# Patient Record
Sex: Male | Born: 1995 | Race: White | Hispanic: No | Marital: Single | Smoking: Never smoker
Health system: Southern US, Community
[De-identification: ages and names within clinical notes are randomized; demographics above are authoritative.]

---
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
## 2014-10-15 ENCOUNTER — Emergency Department (HOSPITAL_BASED_OUTPATIENT_CLINIC_OR_DEPARTMENT_OTHER)
Admission: EM | Admit: 2014-10-15 | Discharge: 2014-10-15 | Disposition: A | Discharge status: Home / Self Care | Payer: Worker's Compensation | Attending: Emergency Medicine | Admitting: Emergency Medicine

## 2014-10-15 ENCOUNTER — Encounter (HOSPITAL_BASED_OUTPATIENT_CLINIC_OR_DEPARTMENT_OTHER): Payer: Self-pay | Admitting: *Deleted

## 2014-10-15 ENCOUNTER — Emergency Department (HOSPITAL_BASED_OUTPATIENT_CLINIC_OR_DEPARTMENT_OTHER): Payer: Worker's Compensation

## 2014-10-15 DIAGNOSIS — Y990 Civilian activity done for income or pay: Secondary | ICD-10-CM | POA: Insufficient documentation

## 2014-10-15 DIAGNOSIS — Y9289 Other specified places as the place of occurrence of the external cause: Secondary | ICD-10-CM | POA: Diagnosis not present

## 2014-10-15 DIAGNOSIS — M62830 Muscle spasm of back: Secondary | ICD-10-CM | POA: Diagnosis not present

## 2014-10-15 DIAGNOSIS — Y9389 Activity, other specified: Secondary | ICD-10-CM | POA: Insufficient documentation

## 2014-10-15 DIAGNOSIS — S3992XA Unspecified injury of lower back, initial encounter: Secondary | ICD-10-CM | POA: Insufficient documentation

## 2014-10-15 DIAGNOSIS — X58XXXA Exposure to other specified factors, initial encounter: Secondary | ICD-10-CM | POA: Diagnosis not present

## 2014-10-15 MED ORDER — HYDROCODONE-ACETAMINOPHEN 5-325 MG PO TABS
1.0000 | ORAL_TABLET | Freq: Once | ORAL | Status: AC
  Administered 2014-10-15: 1 via ORAL
  Filled 2014-10-15: qty 1

## 2014-10-15 MED ORDER — HYDROCODONE-ACETAMINOPHEN 5-325 MG PO TABS: 1.0000 | ORAL_TABLET | Freq: Four times a day (QID) | ORAL | Status: AC | PRN

## 2014-10-15 MED ORDER — CYCLOBENZAPRINE HCL 10 MG PO TABS: 10.0000 mg | ORAL_TABLET | Freq: Three times a day (TID) | ORAL | Status: AC | PRN

## 2014-10-15 MED ORDER — CYCLOBENZAPRINE HCL 10 MG PO TABS
10.0000 mg | ORAL_TABLET | Freq: Once | ORAL | Status: AC
  Administered 2014-10-15: 10 mg via ORAL
  Filled 2014-10-15: qty 1

## 2014-10-15 NOTE — ED Provider Notes (Signed)
CSN: [id number]     Arrival date & time 10/15/14  0058 History   First MD Initiated Contact with Patient 10/15/14 0215     Chief Complaint  Patient presents with  . 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 It started to fall and when he went to stop it he twisted his back and felt the sudden onset of pain. He describes the pain as being in the right lower ribs on the back. The pain is moderate to severe. It is worse with movement or deep breathing. He is having difficulty taking deep breaths due to pain. He denies being struck. He denies other injury.  History reviewed. No pertinent past medical history. History reviewed. No pertinent past surgical history. History reviewed. No pertinent family history. History  Substance Use Topics  . Smoking status: Never Smoker   . Smokeless tobacco: Not on file  . Alcohol Use: No    Review of Systems  All other systems reviewed and are negative.   Allergies  Review of patient's allergies indicates no known allergies.  Home Medications   Prior to Admission medications   Not on File   BP 140/79 mmHg  Pulse 91  Temp(Src) 98.2 F (36.8 C) (Oral)  Resp 18  Wt 130 lb (58.968 kg)  SpO2 100%   Physical Exam  General: Well-developed, well-nourished male in no acute distress; appearance consistent with age of record HENT: normocephalic; atraumatic Eyes: pupils equal, round and reactive to light; extraocular muscles intact Neck: supple Heart: regular rate and rhythm Lungs: clear to auscultation bilaterally; shallow breaths due to pain Abdomen: soft; nondistended; nontender; bowel sounds present Back: Tenderness below the right inferior posterior ribs with spasm and tenderness of the adjacent paraspinal muscles Extremities: No deformity; full range of motion; pulses  normal Neurologic: Awake, alert and oriented; motor function intact in all extremities and symmetric; no facial droop Skin: Warm and dry Psychiatric: Flat affect    ED Course  Procedures (including critical care time)   MDM  Nursing notes and vitals signs, including pulse oximetry, reviewed.  Summary of this visit's results, reviewed by myself:  Imaging Studies: Dg Ribs Unilateral W/chest Right  10/15/2014   CLINICAL DATA:  Right lower rib pain, after twisting injury at work. Initial encounter.  EXAM: RIGHT RIBS AND CHEST - 3+ VIEW  COMPARISON:  None.  FINDINGS: No displaced rib fractures are seen. The site of the patient's symptoms is below the level of the right twelfth rib.  The lungs are well-aerated and clear. There is no evidence of focal opacification, pleural effusion or pneumothorax.  The cardiomediastinal silhouette is within normal limits. No acute osseous abnormalities are seen.  IMPRESSION: No acute cardiopulmonary process seen; no displaced rib fractures identified. The site of the patient's symptoms is below the level of the right twelfth rib.   Electronically Signed   By: Roanna RaiderJeffery  Chang M.D.   On: 10/15/2014 03:08      Hanley SeamenJohn L Mana Haberl, MD 10/15/14 [id number]

## 2014-10-15 NOTE — ED Notes (Signed)
Pt hurt back while twisting at work.

## 2017-01-20 ENCOUNTER — Encounter (HOSPITAL_COMMUNITY): Payer: Self-pay | Admitting: Emergency Medicine

## 2017-01-20 ENCOUNTER — Emergency Department (HOSPITAL_COMMUNITY): Payer: Managed Care, Other (non HMO)

## 2017-01-20 ENCOUNTER — Emergency Department (HOSPITAL_COMMUNITY)
Admission: EM | Admit: 2017-01-20 | Discharge: 2017-01-20 | Disposition: A | Discharge status: Home / Self Care | Payer: Managed Care, Other (non HMO) | Attending: Emergency Medicine | Admitting: Emergency Medicine

## 2017-01-20 DIAGNOSIS — R112 Nausea with vomiting, unspecified: Secondary | ICD-10-CM

## 2017-01-20 DIAGNOSIS — K219 Gastro-esophageal reflux disease without esophagitis: Secondary | ICD-10-CM | POA: Diagnosis not present

## 2017-01-20 DIAGNOSIS — R1011 Right upper quadrant pain: Secondary | ICD-10-CM

## 2017-01-20 LAB — COMPREHENSIVE METABOLIC PANEL
ALT: 14 U/L — ABNORMAL LOW (ref 17–63)
ANION GAP: 10 (ref 5–15)
AST: 25 U/L (ref 15–41)
Albumin: 5 g/dL (ref 3.5–5.0)
Alkaline Phosphatase: 86 U/L (ref 38–126)
BILIRUBIN TOTAL: 1.2 mg/dL (ref 0.3–1.2)
BUN: 13 mg/dL (ref 6–20)
CHLORIDE: 102 mmol/L (ref 101–111)
CO2: 26 mmol/L (ref 22–32)
Calcium: 10 mg/dL (ref 8.9–10.3)
Creatinine, Ser: 0.96 mg/dL (ref 0.61–1.24)
Glucose, Bld: 92 mg/dL (ref 65–99)
POTASSIUM: 3.8 mmol/L (ref 3.5–5.1)
Sodium: 138 mmol/L (ref 135–145)
TOTAL PROTEIN: 7.8 g/dL (ref 6.5–8.1)

## 2017-01-20 LAB — CBC
HCT: 50 % (ref 39.0–52.0)
Hemoglobin: 17.6 g/dL — ABNORMAL HIGH (ref 13.0–17.0)
MCH: 30.5 pg (ref 26.0–34.0)
MCHC: 35.2 g/dL (ref 30.0–36.0)
MCV: 86.7 fL (ref 78.0–100.0)
PLATELETS: 253 10*3/uL (ref 150–400)
RBC: 5.77 MIL/uL (ref 4.22–5.81)
RDW: 12.7 % (ref 11.5–15.5)
WBC: 5.8 10*3/uL (ref 4.0–10.5)

## 2017-01-20 LAB — URINALYSIS, ROUTINE W REFLEX MICROSCOPIC
BILIRUBIN URINE: NEGATIVE
Glucose, UA: NEGATIVE mg/dL
HGB URINE DIPSTICK: NEGATIVE
Ketones, ur: NEGATIVE mg/dL
Leukocytes, UA: NEGATIVE
Nitrite: NEGATIVE
PROTEIN: NEGATIVE mg/dL
SPECIFIC GRAVITY, URINE: 1.012 (ref 1.005–1.030)
pH: 7 (ref 5.0–8.0)

## 2017-01-20 LAB — LIPASE, BLOOD: LIPASE: 26 U/L (ref 11–51)

## 2017-01-20 MED ORDER — ONDANSETRON 4 MG PO TBDP
4.0000 mg | ORAL_TABLET | Freq: Once | ORAL | Status: AC | PRN
Start: 2017-01-20 — End: 2017-01-20
  Administered 2017-01-20: 4 mg via ORAL
  Filled 2017-01-20: qty 1

## 2017-01-20 MED ORDER — MORPHINE SULFATE (PF) 4 MG/ML IV SOLN
4.0000 mg | Freq: Once | INTRAVENOUS | Status: AC
  Administered 2017-01-20: 4 mg via INTRAVENOUS
  Filled 2017-01-20: qty 1

## 2017-01-20 MED ORDER — GI COCKTAIL ~~LOC~~
30.0000 mL | Freq: Once | ORAL | Status: AC
  Administered 2017-01-20: 30 mL via ORAL
  Filled 2017-01-20: qty 30

## 2017-01-20 MED ORDER — SODIUM CHLORIDE 0.9 % IV BOLUS (SEPSIS)
1000.0000 mL | Freq: Once | INTRAVENOUS | Status: AC
  Administered 2017-01-20: 1000 mL via INTRAVENOUS

## 2017-01-20 MED ORDER — OMEPRAZOLE 20 MG PO CPDR: 20.0000 mg | DELAYED_RELEASE_CAPSULE | Freq: Every day | ORAL | 0 refills | Status: AC

## 2017-01-20 NOTE — ED Notes (Signed)
Patient transported to Ultrasound 

## 2017-01-20 NOTE — ED Provider Notes (Signed)
[facility]-EMERGENCY DEPT Provider Note   CSN: [id number] Arrival date & time: 01/20/17  1611     History   Chief Complaint Chief Complaint  Patient presents with  . Emesis    HPI Jorge Acosta is a 21 y.o. male.  Jorge Acosta is a 21 y.o. Male who presents to the emergency department with his mother complaining of nausea, vomiting and right upper quadrant and epigastric abdominal pain on and off for about 4 days. He was seen by his primary care provider 4 days ago was told he might have gastritis or gallbladder problems. He was started on Zofran. He reports his pain comes and goes and seems to be worse after eating. He denies previous abdominal surgeries. He denies excessive burping or belching. He denies fevers, hematemesis, diarrhea, urinary symptoms, chest pain, shortness of breath or rashes.   The history is provided by the patient, medical records and a parent. The history is limited by a language barrier. No language interpreter was used.  Emesis   Associated symptoms include abdominal pain. Pertinent negatives include no chills, no cough, no diarrhea, no fever and no headaches.  Diarrhea   Associated symptoms include abdominal pain and vomiting. Pertinent negatives include no chills, no headaches and no cough.    History reviewed. No pertinent past medical history.  There are no active problems to display for this patient.   History reviewed. No pertinent surgical history.     Home Medications    Prior to Admission medications   Medication Sig Start Date End Date Taking? Authorizing Provider  cyclobenzaprine (FLEXERIL) 10 MG tablet Take 1 tablet (10 mg total) by mouth 3 (three) times daily as needed for muscle spasms. 10/15/14   Molpus, John, MD  HYDROcodone-acetaminophen (NORCO/VICODIN) 5-325 MG per tablet Take 1-2 tablets by mouth every 6 (six) hours as needed (for pain). 10/15/14   Molpus, John, MD  omeprazole (PRILOSEC) 20 MG capsule Take 1 capsule (20 mg  total) by mouth daily. 01/20/17   Everlene Farrier, PA-C    Family History No family history on file.  Social History Social History  Substance Use Topics  . Smoking status: Never Smoker  . Smokeless tobacco: Never Used  . Alcohol use No     Allergies   Patient has no known allergies.   Review of Systems Review of Systems  Constitutional: Negative for chills and fever.  HENT: Negative for congestion and sore throat.   Eyes: Negative for visual disturbance.  Respiratory: Negative for cough and shortness of breath.   Cardiovascular: Negative for chest pain.  Gastrointestinal: Positive for abdominal pain, nausea and vomiting. Negative for abdominal distention, blood in stool and diarrhea.  Genitourinary: Negative for difficulty urinating, dysuria, flank pain, frequency, hematuria, penile pain, testicular pain and urgency.  Musculoskeletal: Negative for back pain and neck pain.  Skin: Negative for rash.  Neurological: Negative for light-headedness and headaches.     Physical Exam Updated Vital Signs BP 112/70 (BP Location: Right Arm)   Pulse (!) 54   Temp 98.6 F (37 C) (Oral)   Resp 16   Ht 5\' 9"  (1.753 m)   Wt 69.9 kg (154 lb)   SpO2 96%   BMI 22.74 kg/m   Physical Exam  Constitutional: He appears well-developed and well-nourished. No distress.  Nontoxic appearing. Thin male.  HENT:  Head: Normocephalic and atraumatic.  Mouth/Throat: Oropharynx is clear and moist.  Eyes: Conjunctivae are normal. Pupils are equal, round, and reactive to light. Right eye exhibits  no discharge. Left eye exhibits no discharge.  Neck: Neck supple.  Cardiovascular: Normal rate, regular rhythm, normal heart sounds and intact distal pulses.  Exam reveals no gallop and no friction rub.   No murmur heard. Pulmonary/Chest: Effort normal and breath sounds normal. No respiratory distress. He has no wheezes. He has no rales.  Abdominal: Soft. Bowel sounds are normal. He exhibits no distension  and no mass. There is tenderness. There is no rebound and no guarding. No hernia.  Abdomen is soft bowel sounds are present. Patient has mild epigastric and right upper quadrant abdominal tenderness to palpation. No CVA or flank tenderness.  Musculoskeletal: He exhibits no edema.  Lymphadenopathy:    He has no cervical adenopathy.  Neurological: He is alert. Coordination normal.  Skin: Skin is warm and dry. Capillary refill takes less than 2 seconds. No rash noted. He is not diaphoretic. No erythema. No pallor.  Psychiatric: He has a normal mood and affect. His behavior is normal.  Nursing note and vitals reviewed.    ED Treatments / Results  Labs (all labs ordered are listed, but only abnormal results are displayed) Labs Reviewed  COMPREHENSIVE METABOLIC PANEL - Abnormal; Notable for the following:       Result Value   ALT 14 (*)    All other components within normal limits  CBC - Abnormal; Notable for the following:    Hemoglobin 17.6 (*)    All other components within normal limits  LIPASE, BLOOD  URINALYSIS, ROUTINE W REFLEX MICROSCOPIC    EKG  EKG Interpretation None       Radiology [place] Abdomen Limited  Result Date: 01/20/2017 CLINICAL DATA:  Right upper quadrant abdominal pain x4 days, nausea/vomiting EXAM: ULTRASOUND ABDOMEN LIMITED RIGHT UPPER QUADRANT COMPARISON:  None. FINDINGS: Gallbladder: No gallstones, gallbladder wall thickening, or pericholecystic fluid. Common bile duct: Diameter: 3 mm Liver: No focal lesion identified. Within normal limits in parenchymal echogenicity. IMPRESSION: Negative right upper quadrant ultrasound. Electronically Signed   By: Charline BillsSriyesh  Krishnan M.D.   On: 01/20/2017 19:47    Procedures Procedures (including critical care time)  Medications Ordered in ED Medications  ondansetron (ZOFRAN-ODT) disintegrating tablet 4 mg (4 mg Oral Given 01/20/17 1955)  sodium chloride 0.9 % bolus 1,000 mL (0 mLs Intravenous Stopped 01/20/17 2056)  morphine  4 MG/ML injection 4 mg (4 mg Intravenous Given 01/20/17 2001)  gi cocktail (Maalox,Lidocaine,Donnatal) (30 mLs Oral Given 01/20/17 2004)     Initial Impression / Assessment and Plan / ED Course  I have reviewed the triage vital signs and the nursing notes.  Pertinent labs & imaging results that were available during my care of the patient were reviewed by me and considered in my medical decision making (see chart for details).    This is a 21 y.o. Male who presents to the emergency department with his mother complaining of nausea, vomiting and right upper quadrant and epigastric abdominal pain on and off for about 4 days. He was seen by his primary care provider 4 days ago was told he might have gastritis or gallbladder problems. He was started on Zofran. He reports his pain comes and goes and seems to be worse after eating. He denies previous abdominal surgeries. He denies excessive burping or belching. On exam the patient is afebrile and nontoxic appearing. His abdomen is soft and is not epigastric and right upper quadrant abdominal tenderness to palpation. No peritoneal signs. No hernias. Urinalysis shows no sign of infection. Lipase is within normal limits.  His CMP is unremarkable. Normal liver enzymes. CBC is unremarkable. No leukocytosis. Right upper quadrant ultrasound was obtained which is unremarkable. After pain medicine and GI cocktail patient reports his pain and nausea have completely resolved. He is tolerating graham crackers and ginger ale without nausea or vomiting. I suspect GERD versus peptic ulcer versus gallbladder dysfunction. If his symptoms persist will have follow-up at [facility] surgery. Otherwise discharge with omeprazole and Zofran. Patient has Zofran at home. Tylenol as needed for pain. I discussed return precautions. I advised the patient to follow-up with their primary care provider this week. I advised the patient to return to the emergency department with new or  worsening symptoms or new concerns. The patient verbalized understanding and agreement with plan.    Final Clinical Impressions(s) / ED Diagnoses   Final diagnoses:  Non-intractable vomiting with nausea, unspecified vomiting type  RUQ abdominal pain  Gastroesophageal reflux disease without esophagitis    New Prescriptions New Prescriptions   OMEPRAZOLE (PRILOSEC) 20 MG CAPSULE    Take 1 capsule (20 mg total) by mouth daily.     Everlene Farrier, PA-C 01/20/17 2120    Tegeler, Canary Brim, MD 01/21/17 [phone]

## 2017-01-20 NOTE — ED Triage Notes (Signed)
To ED with c/o abd pain with constant nausea-- saw reg dr -- Thursday -- was told that it possibly could be his gall bladder-- has continued nausea-- has been taking zofran --

## 2019-05-30 IMAGING — US US ABDOMEN LIMITED
1 series · 14 of 25 positions shown · non-contrast
Comparison: None.

CLINICAL DATA: Right upper quadrant abdominal pain x4 days,
nausea/vomiting

EXAM:
ULTRASOUND ABDOMEN LIMITED RIGHT UPPER QUADRANT

[Series 1: us abdomen limited · 0.14mm/px · 14 of 43 slices shown]
[im 1/43]
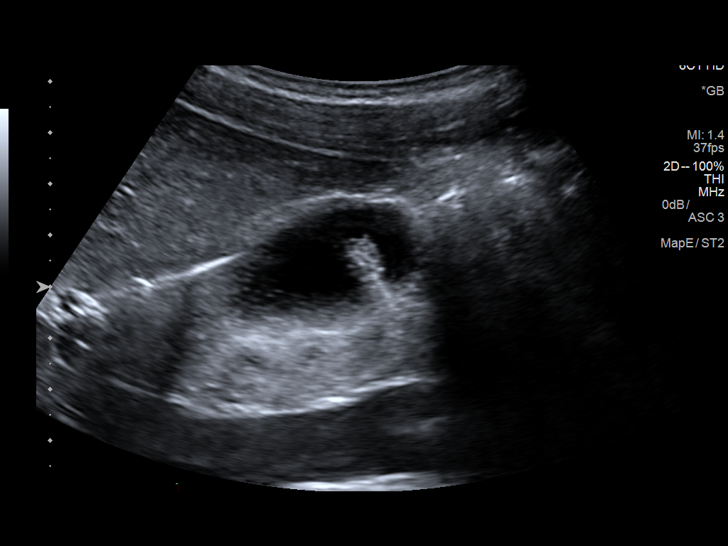
[im 4/43]
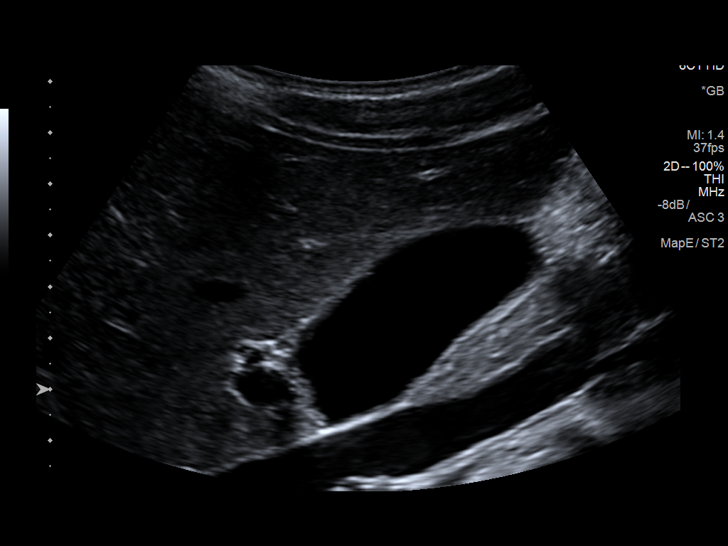
[im 8/43]
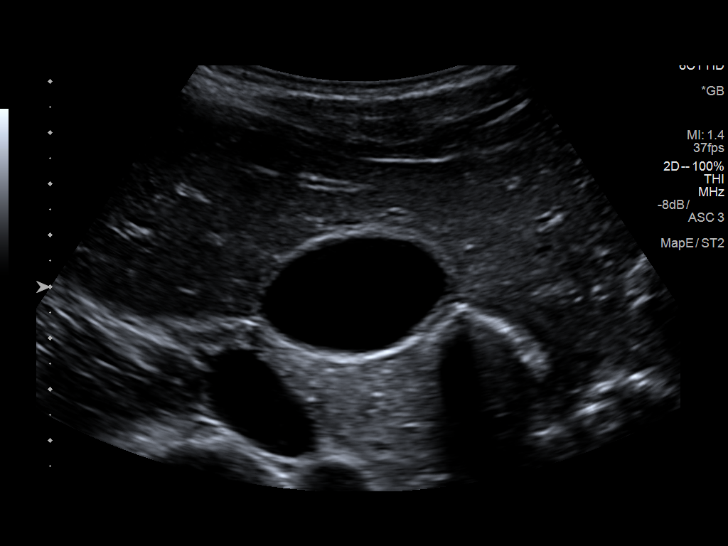
[im 11/43]
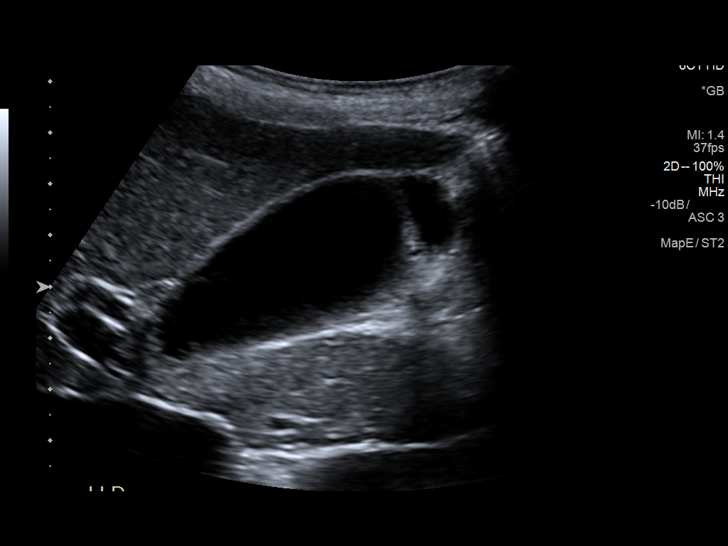
[im 15/43]
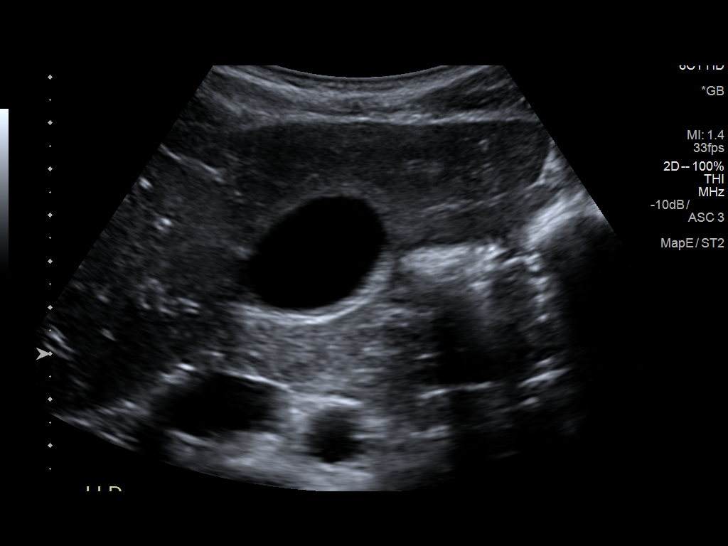
[im 16/43]
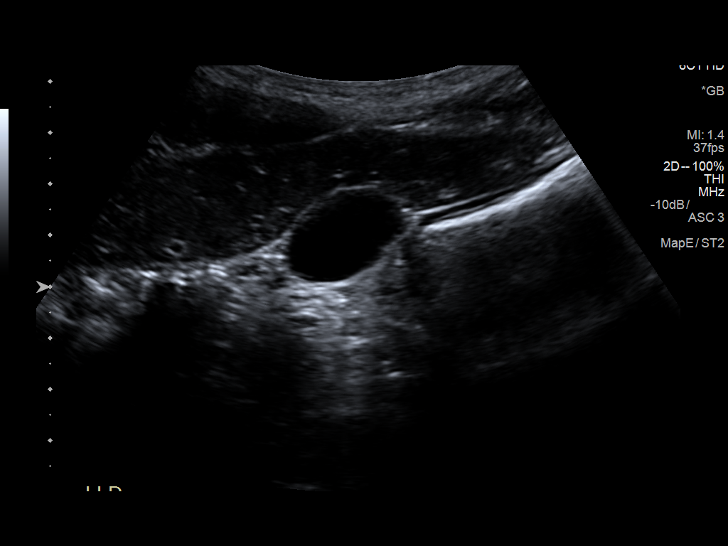
[im 20/43]
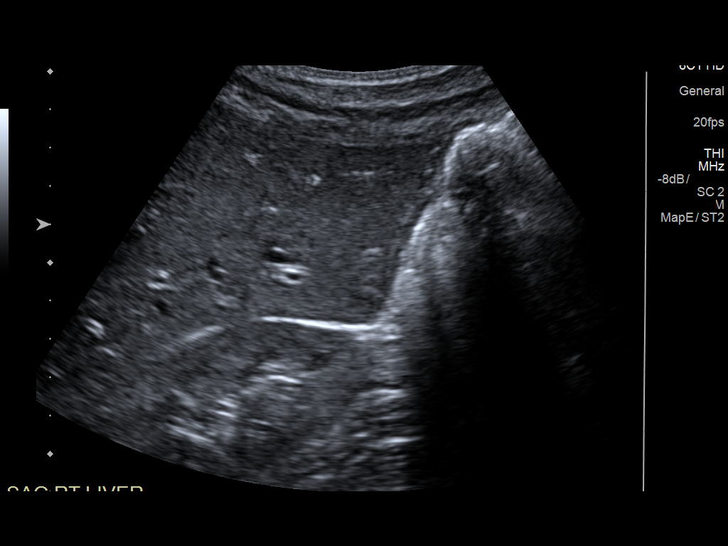
[im 23/43]
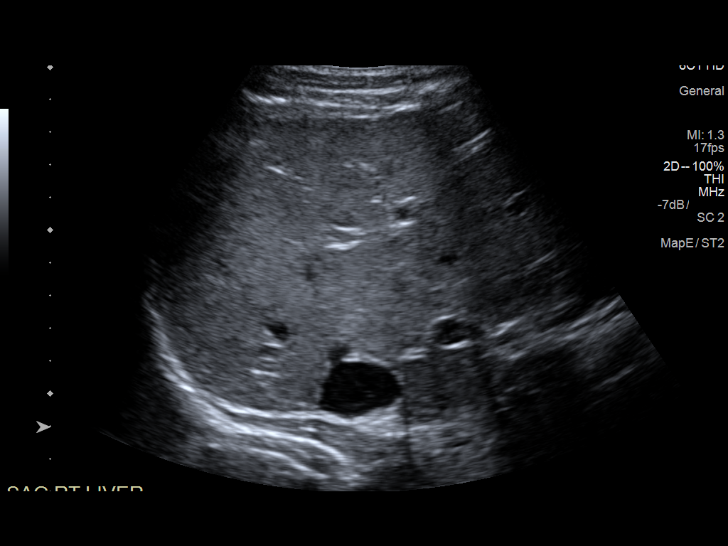
[im 27/43]
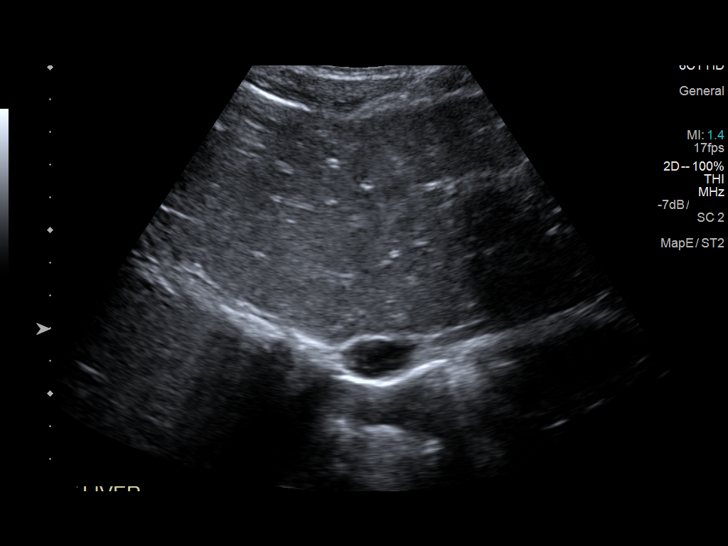
[im 29/43]
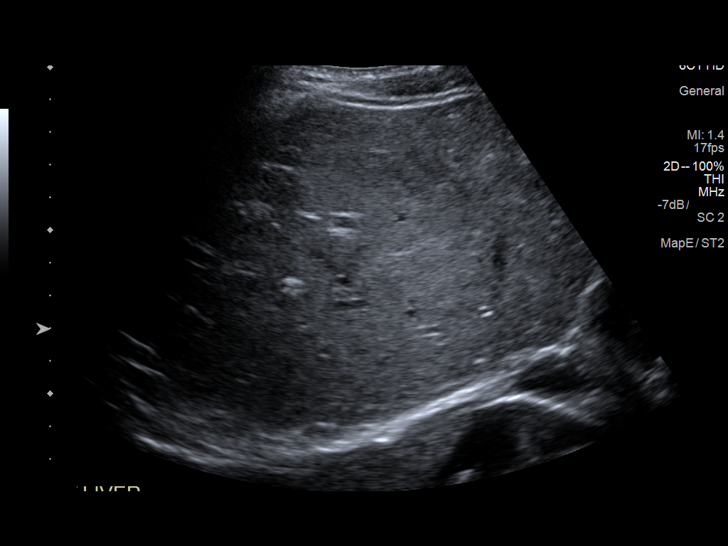
[im 32/43]
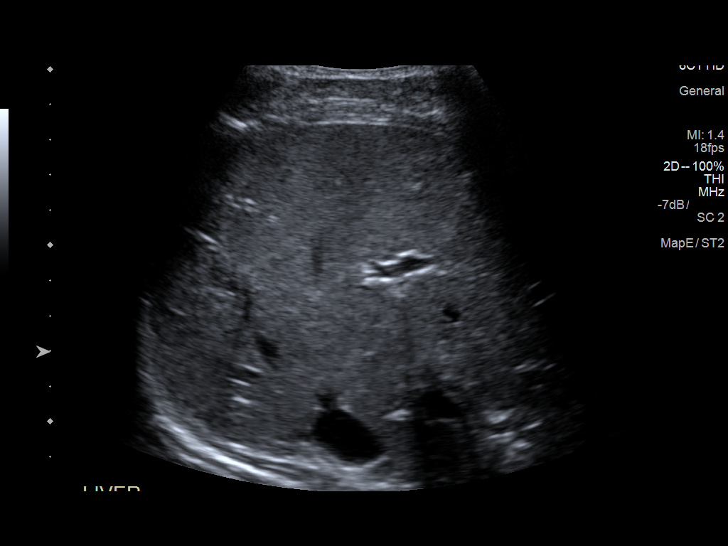
[im 36/43]
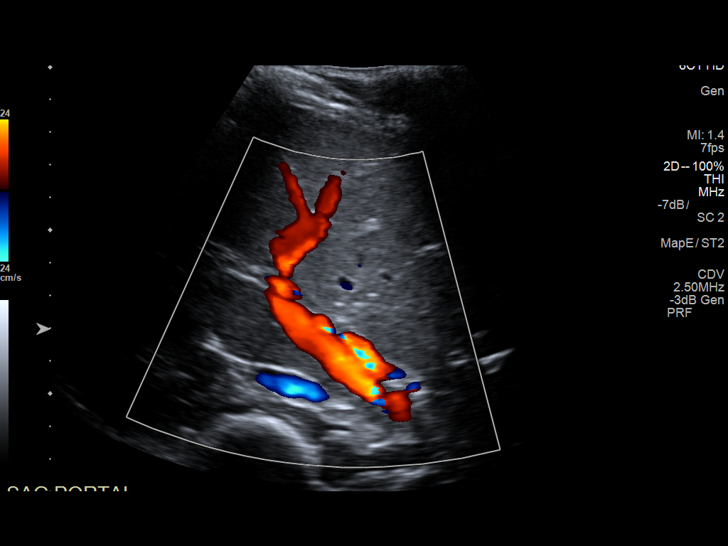
[im 39/43]
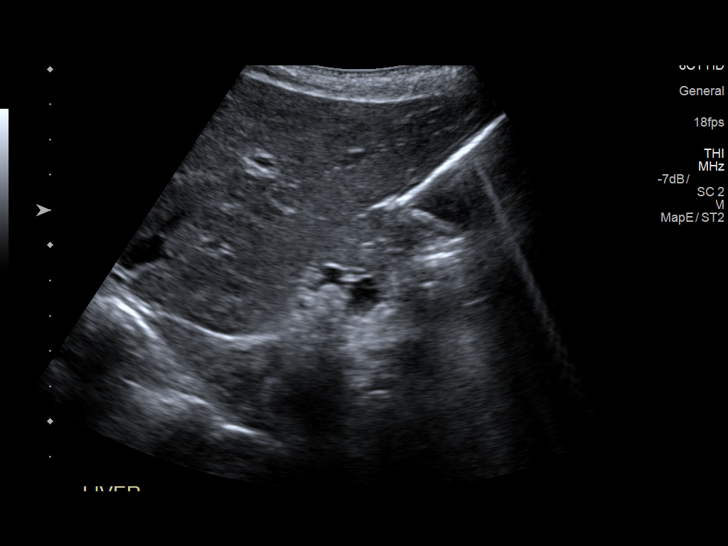
[im 43/43]
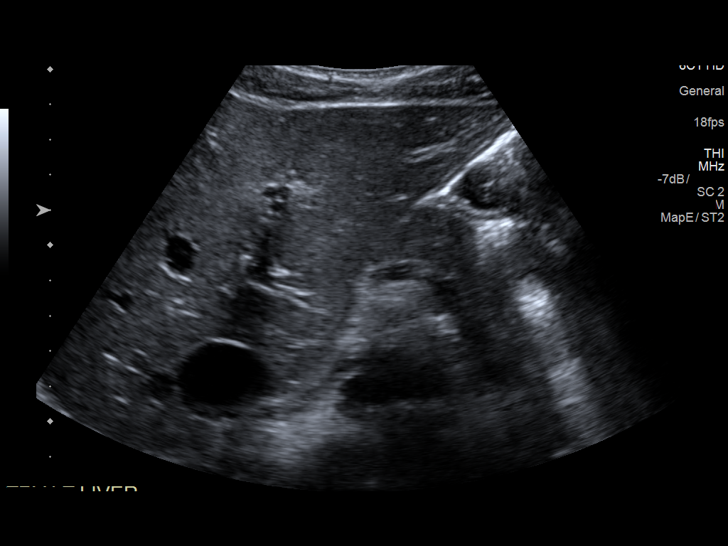

[14 of 25 positions shown; findings below may reference images not displayed]

FINDINGS: Gallbladder:

No gallstones, gallbladder wall thickening, or pericholecystic
fluid.

Common bile duct:

Diameter: 3 mm

Liver:

No focal lesion identified. Within normal limits in parenchymal
echogenicity.
IMPRESSION: Negative right upper quadrant ultrasound.
